# Patient Record
Sex: Male | Born: 1955 | Race: White | Hispanic: No | Smoking: Current every day smoker
Health system: Southern US, Community
[De-identification: ages and names within clinical notes are randomized; demographics above are authoritative.]

## PROBLEM LIST (undated history)

## (undated) DIAGNOSIS — T7840XA Allergy, unspecified, initial encounter: Secondary | ICD-10-CM

## (undated) HISTORY — DX: Allergy, unspecified, initial encounter: T78.40XA

---

## 2006-09-21 ENCOUNTER — Encounter: Admission: RE | Admit: 2006-09-21 | Discharge: 2006-09-21 | Payer: Self-pay | Admitting: Emergency Medicine

## 2006-09-28 ENCOUNTER — Encounter: Admission: RE | Admit: 2006-09-28 | Discharge: 2006-09-28 | Payer: Self-pay | Admitting: Emergency Medicine

## 2008-01-16 IMAGING — CT CT ABDOMEN W/O CM
3 series · 13 of 32 positions shown, 18 images · IV contrast (agent unspecified)
Comparison: None.
COMPARISON: None.

CLINICAL DATA: Two days left lower quadrant pain.  Microhematuria. 
ABDOMEN CT WITHOUT CONTRAST:
TECHNIQUE: Multidetector CT imaging of the abdomen was performed following the standard protocol without IV contrast.
TECHNIQUE: Multidetector CT imaging of the pelvis was performed following the standard protocol without IV contrast.

[Series 2: renal stone w/o · axial · non-contrast · 0.89mm/px · z∈[-310,-75]mm · 4 of 79 slices shown, 9 images]
[im 16/79  soft-tissue]
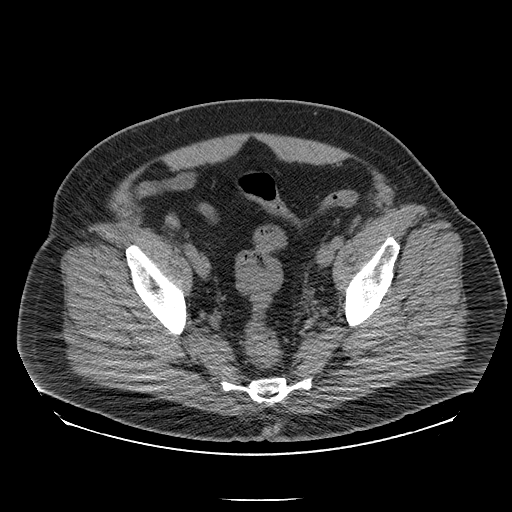
[im 16/79  lung]
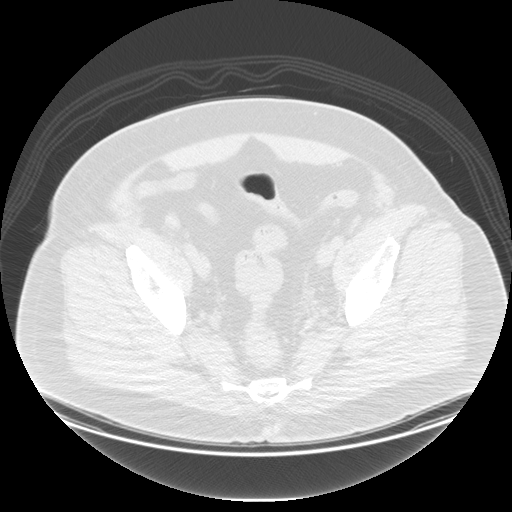
[im 16/79  bone]
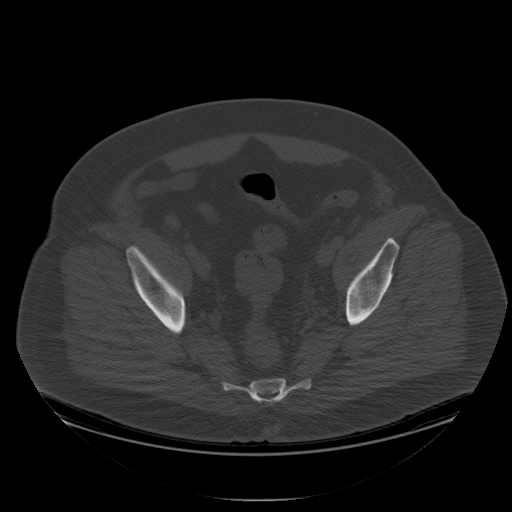
[im 32/79  soft-tissue]
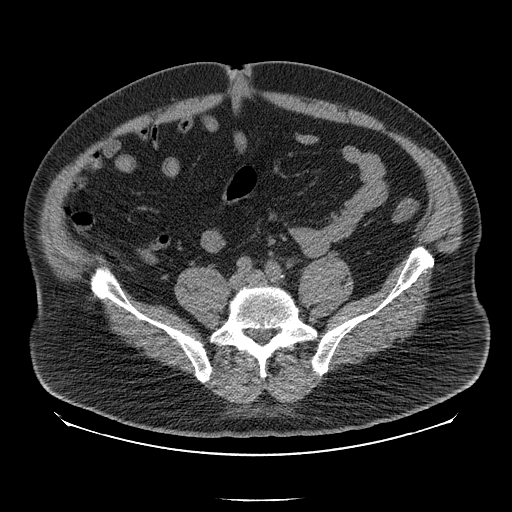
[im 32/79  lung]
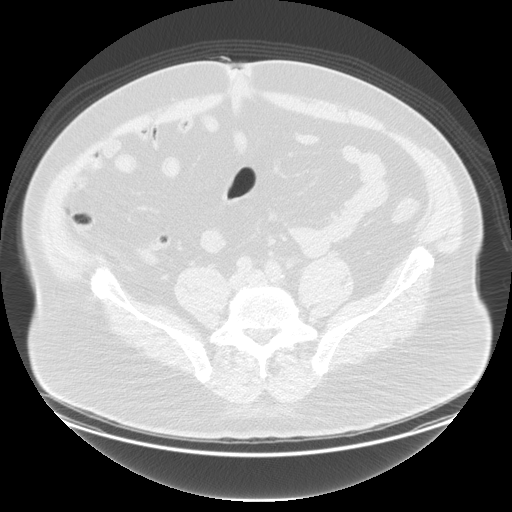
[im 47/79  soft-tissue]
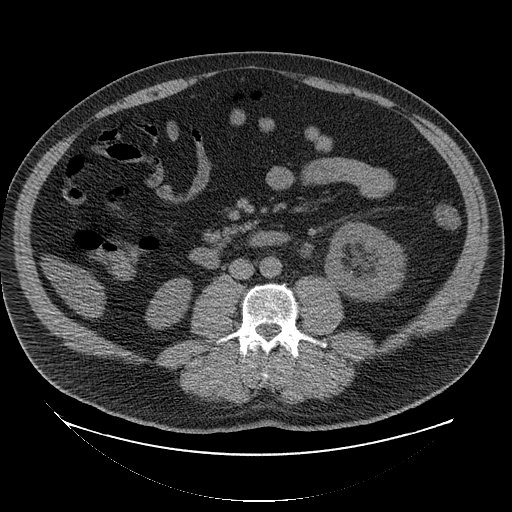
[im 47/79  lung]
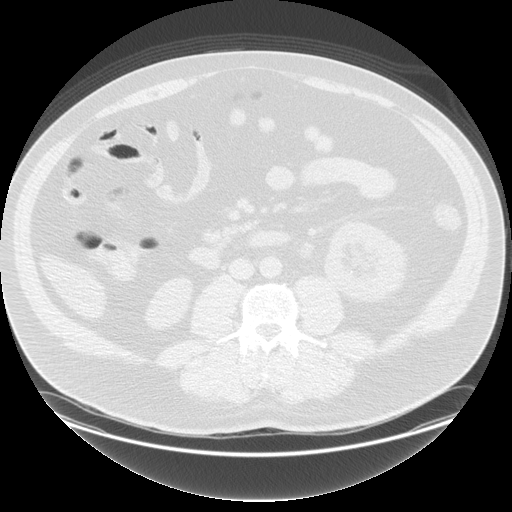
[im 63/79  soft-tissue]
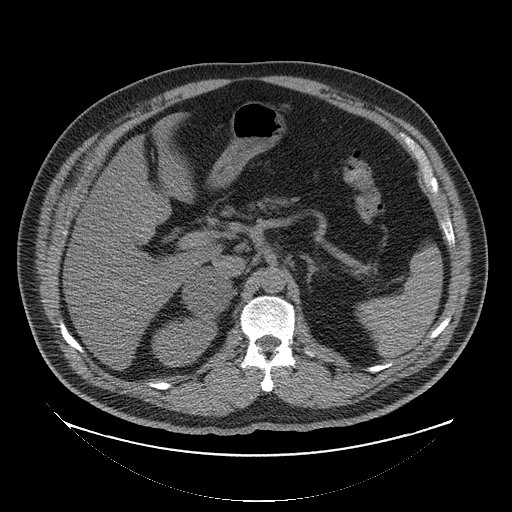
[im 63/79  lung]
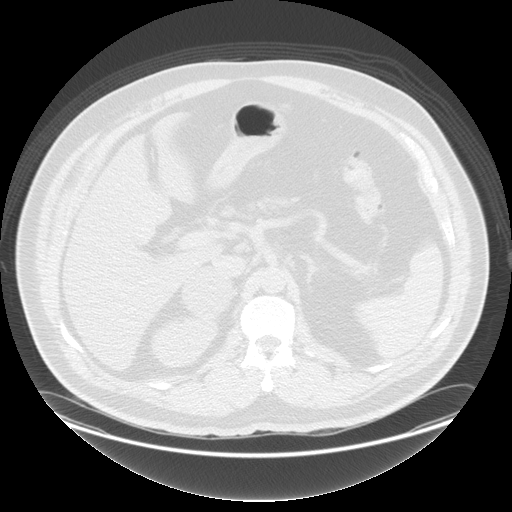

[Series 400: coronal · coronal · 0.89mm/px · 1 of 123 slices shown]
[im 13/123  soft-tissue]
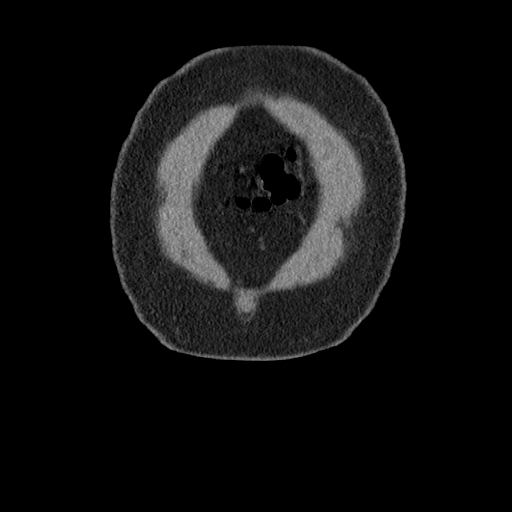

[Series 401: sagittal · sagittal · 0.89mm/px · 8 of 170 slices shown]
[im 13/170  soft-tissue]
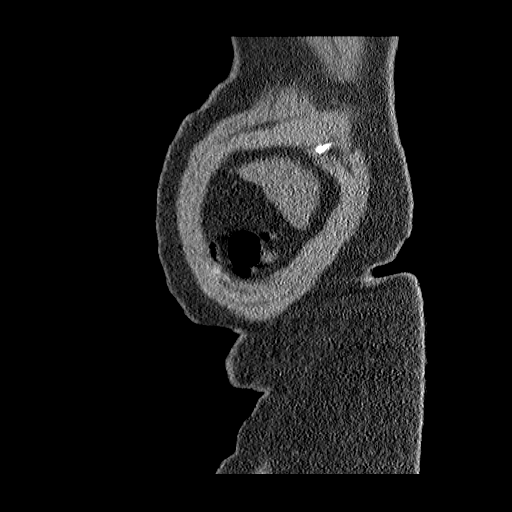
[im 37/170  soft-tissue]
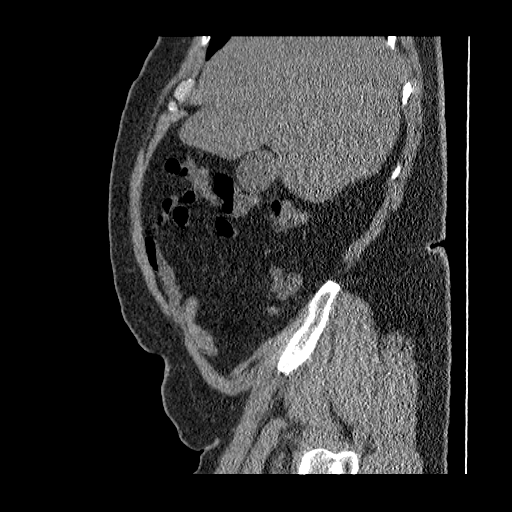
[im 61/170  soft-tissue]
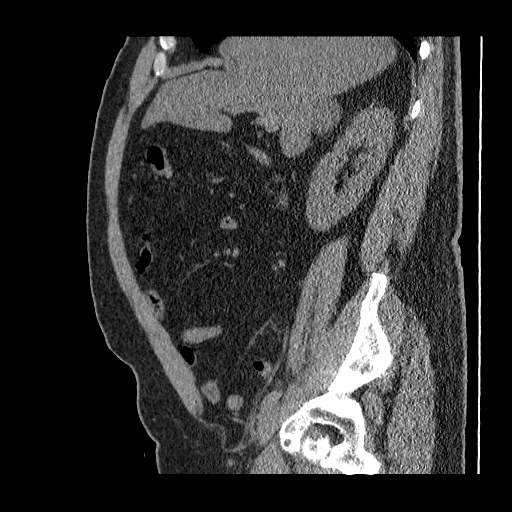
[im 73/170  soft-tissue]
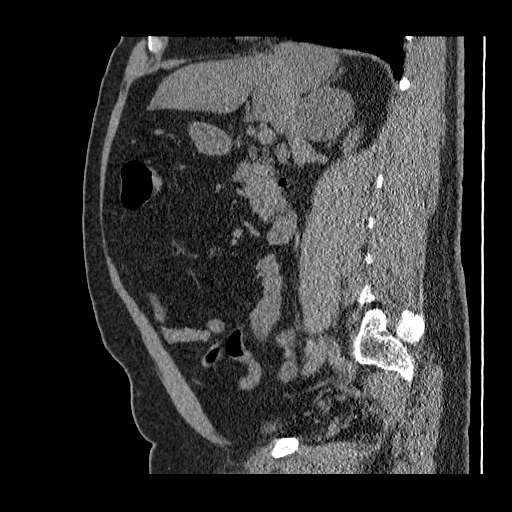
[im 97/170  soft-tissue]
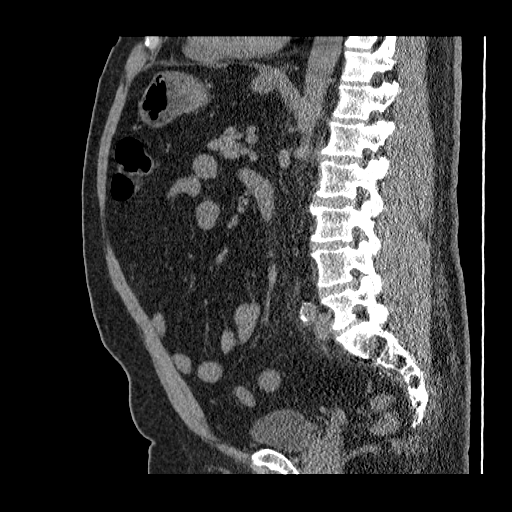
[im 109/170  soft-tissue]
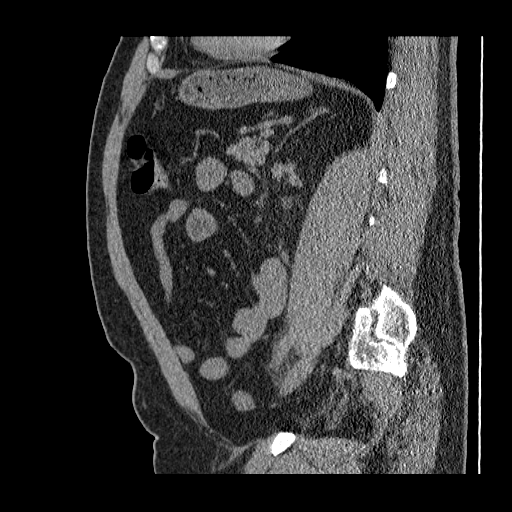
[im 133/170  soft-tissue]
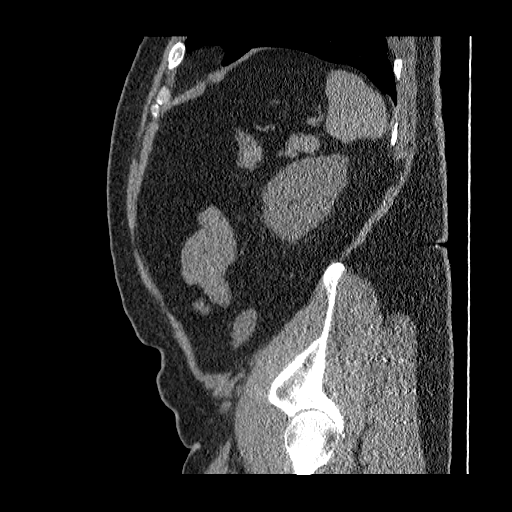
[im 157/170  soft-tissue]
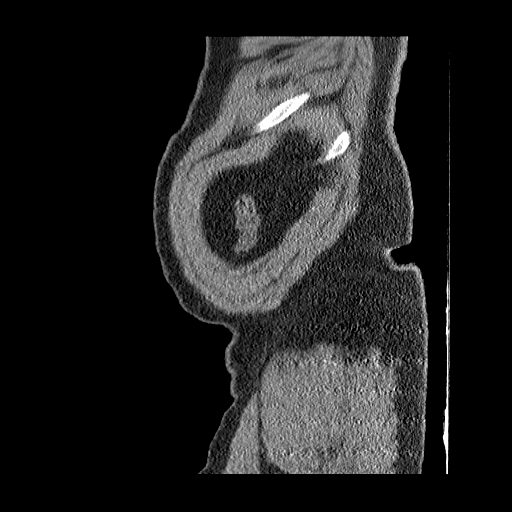

[13 of 32 positions shown; findings below may reference images not displayed]

FINDINGS: Left kidney is slightly larger with respect to the right with slight to moderate left hydronephrosis/hydroureter.  iny bilateral nonobstructing 3 mm renal calculi are seen with the upper urinary tract otherwise unremarkable.  The right adrenal gland measures 5.3 cm with central Hounsfield units 18 plus or minus 32.  This favors a very large adrenal adenoma, yet possible primary benign or malignant adrenal tumor.  Metastatic disease is felt less likely.  Recommend adrenal MRI due to size of lesion for further evaluation unless otherwise clinically indicated.  Slight diffuse fatty infiltration of the liver is noted.  Remaining abdominal organs appear normal without inflammation, free fluid, nor adenopathy.
IMPRESSION: 1.  Slight to moderate left hydronephrosis/left hydroureter.
2.  Several bilateral tiny nonobstructing renal calculi.
3.  5.3 cm right adrenal adenoma or less likely primary benign or malignant adrenal tumor ? recommend adrenal MRI confirmation unless otherwise clinically indicated.
4.  Slight diffuse fatty infiltration of the liver. 
5.  Otherwise negative.
PELVIS CT WITHOUT CONTRAST:
FINDINGS: Left ureterectasis is noted with 4 mm left ureterovesicle junction calculus.  Distal urinary tract is otherwise unremarkable.  Remaining pelvic organs appear normal.  Slight atheromatous vascular calcification, distal abdominal aorta and bilateral iliac arteries noted.  Remaining pelvic organs appear normal without inflammation, free fluid, nor adenopathy.
IMPRESSION: 1.  Partially obstructing 4 mm left ureterovesicle junction calculus with slight left ureterectasis.
2.  Slight atheromatous vascular calcification.
3.  Otherwise negative.

## 2013-02-22 ENCOUNTER — Ambulatory Visit (INDEPENDENT_AMBULATORY_CARE_PROVIDER_SITE_OTHER): Payer: BC Managed Care – PPO | Admitting: Family Medicine

## 2013-02-22 VITALS — BP 132/90 | HR 75 | Temp 98.3°F | Resp 19 | Ht 69.0 in | Wt 243.0 lb

## 2013-02-22 DIAGNOSIS — J209 Acute bronchitis, unspecified: Secondary | ICD-10-CM

## 2013-02-22 MED ORDER — AZITHROMYCIN 250 MG PO TABS: ORAL_TABLET | ORAL | Status: DC

## 2013-02-22 MED ORDER — HYDROCOD POLST-CHLORPHEN POLST 10-8 MG/5ML PO LQCR: 5.0000 mL | Freq: Two times a day (BID) | ORAL | Status: DC | PRN

## 2013-02-22 NOTE — Progress Notes (Signed)
Subjective:    Patient ID: Scott Zhang, male    DOB: 1956/02/08, 56 y.o.   MRN: [id number] Chief Complaint  Patient presents with  . Fever    X Sunday  . Cough    X Sunday  . Chest Congestion    X Sunday    HPI  This chart was scribed for Sherren Mocha, MD, by Ellin Mayhew, ED Scribe. This patient was seen in room 4 and the patient's care was started at 11:54 AM.  Chief Complaint  Patient presents with  . Fever    X Sunday  . Cough    X Sunday  . Chest Congestion    X Sunday    HPI Comments: Nicholai Willette is a 57 y.o. male who presents to the [facility] complaining of a non-productive cough with associated chest tightness that began after he developed a fever and congestion five days ago. His symptoms began with a fever measured at 101 degrees and he reports his fever breaking last night. He has been taking tylenol for his fever and mucinex for congestion. Today, he states he has felt much better overall, including his congestion and fever; however, he currently presents with chest tightness from his cough. Patient had noted that his cough is worsened by lying down and has been sleeping on a reclining chair to cope. He denies any wheezing or SOB.  Patient does confirm smoking currently.  Past Medical History  Diagnosis Date  . Allergy     History reviewed. No pertinent past surgical history.  Family History  Problem Relation Age of Onset  . Arthritis Brother     History   Social History  . Marital Status: Divorced    Spouse Name: N/A    Number of Children: N/A  . Years of Education: N/A   Occupational History  . Not on file.   Social History Main Topics  . Smoking status: Current Every Day Smoker  . Smokeless tobacco: Not on file  . Alcohol Use: Yes  . Drug Use: No  . Sexual Activity: Not on file   Other Topics Concern  . Not on file   Social History Narrative  . No narrative on file    No Known Allergies  No current  outpatient prescriptions on file prior to visit.   No current facility-administered medications on file prior to visit.    BP 132/90  Pulse 75  Temp(Src) 98.3 F (36.8 C) (Oral)  Resp 19  Ht 5\' 9"  (1.753 m)  Wt 243 lb (110.224 kg)  BMI 35.87 kg/m2  SpO2 95%   Review of Systems  Constitutional: Positive for appetite change. Negative for fever, chills and activity change.  HENT: Positive for congestion and voice change. Negative for rhinorrhea, sinus pressure and sore throat.   Eyes: Negative.   Respiratory: Positive for cough, chest tightness and wheezing (Mild wheezing). Negative for choking and shortness of breath.   Cardiovascular: Negative for chest pain and leg swelling.  Gastrointestinal: Negative for nausea, vomiting and abdominal pain.  Genitourinary: Negative.   Musculoskeletal: Negative for myalgias and neck pain.  Neurological: Negative for weakness.   BP 132/90  Pulse 75  Temp(Src) 98.3 F (36.8 C) (Oral)  Resp 19  Ht 5\' 9"  (1.753 m)  Wt 243 lb (110.224 kg)  BMI 35.87 kg/m2  SpO2 95% Objective:   Physical Exam  Nursing note and vitals reviewed. Constitutional: He is oriented to person, place, and time. He appears well-developed  and well-nourished. No distress.  HENT:  Head: Normocephalic and atraumatic.  Right Ear: External ear normal. Tympanic membrane is injected.  Left Ear: External ear normal.  Mouth/Throat: Posterior oropharyngeal edema and posterior oropharyngeal erythema present. No oropharyngeal exudate.  L ear cerumen build up  Eyes: EOM are normal.  Neck: Neck supple. No tracheal deviation present. No mass and no thyromegaly present.  Cardiovascular: Normal rate, regular rhythm and normal heart sounds.   No murmur heard. Pulmonary/Chest: Effort normal and breath sounds normal. No respiratory distress.  Musculoskeletal: Normal range of motion.  Lymphadenopathy:       Head (right side): No submandibular, no tonsillar, no preauricular and no  posterior auricular adenopathy present.       Head (left side): No submandibular, no tonsillar, no preauricular and no posterior auricular adenopathy present.    He has no cervical adenopathy.       Right: No supraclavicular adenopathy present.       Left: No supraclavicular adenopathy present.  Neurological: He is alert and oriented to person, place, and time.  Skin: Skin is warm and dry.  Psychiatric: He has a normal mood and affect. His behavior is normal.   Assessment & Plan:  12:00 PM-Discussed my suspicion of bronchitis as a likely cause of patient's discomfort and my recommendation for taking azithromycin to alleviate his symptoms and treat any possible bacterially induced inflammation. Patient agreed with treatment plan and was discharged. Acute bronchitis  Meds ordered this encounter  Medications  . guaiFENesin (MUCINEX) 600 MG 12 hr tablet    Sig: Take by mouth 2 (two) times daily.  . Multiple Vitamins-Minerals (CENTRUM ADULTS PO)    Sig: Take by mouth.  . fexofenadine (ALLEGRA) 60 MG tablet    Sig: Take 60 mg by mouth 2 (two) times daily.  Marland Kitchen aspirin 81 MG tablet    Sig: Take 81 mg by mouth daily.  Marland Kitchen omeprazole (PRILOSEC) 10 MG capsule    Sig: Take 10 mg by mouth daily.  . chlorpheniramine-HYDROcodone (TUSSIONEX PENNKINETIC ER) 10-8 MG/5ML LQCR    Sig: Take 5 mLs by mouth every 12 (twelve) hours as needed for cough (cough).    Dispense:  120 mL    Refill:  0  . azithromycin (ZITHROMAX) 250 MG tablet    Sig: Take 2 tabs PO x 1 dose, then 1 tab PO QD x 4 days    Dispense:  6 tablet    Refill:  0    I personally performed the services described in this documentation, which was scribed in my presence. The recorded information has been reviewed and considered, and addended by me as needed.  Norberto Sorenson, MD MPH

## 2013-02-22 NOTE — Patient Instructions (Signed)
Acute Bronchitis Bronchitis is inflammation of the airways that extend from the windpipe into the lungs (bronchi). The inflammation often causes mucus to develop. This leads to a cough, which is the most common symptom of bronchitis.  In acute bronchitis, the condition usually develops suddenly and goes away over time, usually in a couple weeks. Smoking, allergies, and asthma can make bronchitis worse. Repeated episodes of bronchitis may cause further lung problems.  CAUSES Acute bronchitis is most often caused by the same virus that causes a cold. The virus can spread from person to person (contagious).  SIGNS AND SYMPTOMS   Cough.   Fever.   Coughing up mucus.   Body aches.   Chest congestion.   Chills.   Shortness of breath.   Sore throat.  DIAGNOSIS  Acute bronchitis is usually diagnosed through a physical exam. Tests, such as chest X-rays, are sometimes done to rule out other conditions.  TREATMENT  Acute bronchitis usually goes away in a couple weeks. Often times, no medical treatment is necessary. Medicines are sometimes given for relief of fever or cough. Antibiotics are usually not needed but may be prescribed in certain situations. In some cases, an inhaler may be recommended to help reduce shortness of breath and control the cough. A cool mist vaporizer may also be used to help thin bronchial secretions and make it easier to clear the chest.  HOME CARE INSTRUCTIONS  Get plenty of rest.   Drink enough fluids to keep your urine clear or pale yellow (unless you have a medical condition that requires fluid restriction). Increasing fluids may help thin your secretions and will prevent dehydration.   Only take over-the-counter or prescription medicines as directed by your health care provider.   Avoid smoking and secondhand smoke. Exposure to cigarette smoke or irritating chemicals will make bronchitis worse. If you are a smoker, consider using nicotine gum or skin  patches to help control withdrawal symptoms. Quitting smoking will help your lungs heal faster.   Reduce the chances of another bout of acute bronchitis by washing your hands frequently, avoiding people with cold symptoms, and trying not to touch your hands to your mouth, nose, or eyes.   Follow up with your health care provider as directed.  SEEK MEDICAL CARE IF: Your symptoms do not improve after 1 week of treatment.  SEEK IMMEDIATE MEDICAL CARE IF:  You develop an increased fever or chills.   You have chest pain.   You have severe shortness of breath.  You have bloody sputum.   You develop dehydration.  You develop fainting.  You develop repeated vomiting.  You develop a severe headache. MAKE SURE YOU:   Understand these instructions.  Will watch your condition.  Will get help right away if you are not doing well or get worse. Document Released: 03/24/2004 Document Revised: 10/17/2012 Document Reviewed: 08/07/2012 [employer] Patient Information 2014 [employer].  

## 2013-11-08 DIAGNOSIS — Z0271 Encounter for disability determination: Secondary | ICD-10-CM

## 2014-07-21 ENCOUNTER — Ambulatory Visit (INDEPENDENT_AMBULATORY_CARE_PROVIDER_SITE_OTHER): Payer: BC Managed Care – PPO | Admitting: Family Medicine

## 2014-07-21 VITALS — BP 160/80 | HR 92 | Temp 98.8°F | Resp 16 | Ht 69.5 in | Wt 230.0 lb

## 2014-07-21 DIAGNOSIS — L02215 Cutaneous abscess of perineum: Secondary | ICD-10-CM | POA: Diagnosis not present

## 2014-07-21 MED ORDER — AMOXICILLIN-POT CLAVULANATE 875-125 MG PO TABS: 1.0000 | ORAL_TABLET | Freq: Two times a day (BID) | ORAL | Status: AC

## 2014-07-21 NOTE — Patient Instructions (Signed)

## 2014-07-21 NOTE — Progress Notes (Signed)
° °  Subjective:  This chart was scribed for Scott SidleKurt Lauenstein, MD by Stann Oresung-Kai Tsai, Medical Scribe. This patient was seen in room 9 and the patient's care was started 2:59 PM.    Patient ID: Scott BillingsStephen Selway, male    DOB: 02-04-1956, 59 y.o.   MRN: [id number]  HPI Scott Zhang is a 59 y.o. male who [facility] complaining of a gradual onset abscess on the buttock starting last week. He has associated symptoms of fever and chills. He used heat compress on it and it drained a little, but it continued to swell. He had similar soreness in the past. He denies constipation.   He is retired.     Review of Systems  Constitutional: Positive for fever and chills.  Gastrointestinal: Negative for constipation.  Skin: Positive for rash (abscess on the buttock).       Objective:   Physical Exam  Nursing note and vitals reviewed. BP 160/80 mmHg   Pulse 92   Temp(Src) 98.8 F (37.1 C) (Oral)   Resp 16   Ht 5' 9.5" (1.765 m)   Wt 230 lb (104.327 kg)   BMI 33.49 kg/m2   SpO2 98% Right side of perineum, adjacent to anus and extending to the posterior scrotal sac is red, scaly, indurated, and fluctuant. This represents a 6 cm abscess. The area is tender and nondraining.  I called [facility] and they were gracious enough to try to work the patient in this afternoon.     Assessment & Plan:   This chart was scribed in my presence and reviewed by me personally.    ICD-9-CM ICD-10-CM   1. Abscess, perineum 682.2 L02.215 amoxicillin-clavulanate (AUGMENTIN) 875-125 MG per tablet   [facility]  Signed, Scott SidleKurt Lauenstein, MD

## 2014-07-22 ENCOUNTER — Telehealth: Payer: Self-pay

## 2014-07-22 NOTE — Telephone Encounter (Signed)
Pt states he had come to see Dr.Kurt and was sent straight over to a surgeon but had called in some medicine for him and he is really surprised since he didn't do anything so why Did we call in some medicine for him. Please call pt at [phone]

## 2014-07-22 NOTE — Telephone Encounter (Signed)
Pt.notified

## 2014-07-22 NOTE — Telephone Encounter (Signed)
I called in an antibiotic for the abscess.

## 2014-07-22 NOTE — Telephone Encounter (Signed)
Dr L- Do you know how to answer this?

## 2023-04-27 ENCOUNTER — Other Ambulatory Visit (HOSPITAL_COMMUNITY): Payer: Self-pay | Admitting: Family Medicine

## 2023-04-27 DIAGNOSIS — I10 Essential (primary) hypertension: Secondary | ICD-10-CM

## 2023-05-04 ENCOUNTER — Ambulatory Visit (HOSPITAL_COMMUNITY)
Admission: RE | Admit: 2023-05-04 | Discharge: 2023-05-04 | Disposition: A | Discharge status: Home / Self Care | Payer: Self-pay | Source: Ambulatory Visit | Attending: Cardiology | Admitting: Cardiology

## 2023-05-04 DIAGNOSIS — I10 Essential (primary) hypertension: Secondary | ICD-10-CM
